# Patient Record
Sex: Male | Born: 1945 | Race: White | Hispanic: No | Marital: Married | Smoking: Never smoker
Health system: Southern US, Community
[De-identification: ages and names within clinical notes are randomized; demographics above are authoritative.]

## PROBLEM LIST (undated history)

## (undated) DIAGNOSIS — F319 Bipolar disorder, unspecified: Secondary | ICD-10-CM

## (undated) DIAGNOSIS — G20 Parkinson's disease: Secondary | ICD-10-CM

## (undated) DIAGNOSIS — I10 Essential (primary) hypertension: Secondary | ICD-10-CM

## (undated) DIAGNOSIS — I639 Cerebral infarction, unspecified: Secondary | ICD-10-CM

## (undated) HISTORY — PX: TONSILLECTOMY: SUR1361

## (undated) HISTORY — PX: ESOPHAGOGASTRODUODENOSCOPY: SHX1529

## (undated) HISTORY — PX: COLONOSCOPY: SHX174

---

## 2005-07-14 ENCOUNTER — Ambulatory Visit: Payer: Self-pay | Admitting: Unknown Physician Specialty

## 2006-05-19 ENCOUNTER — Ambulatory Visit: Payer: Self-pay | Admitting: Internal Medicine

## 2007-07-09 ENCOUNTER — Other Ambulatory Visit: Payer: Self-pay

## 2007-07-09 ENCOUNTER — Inpatient Hospital Stay: Payer: Self-pay | Admitting: Internal Medicine

## 2010-12-28 ENCOUNTER — Emergency Department: Payer: Self-pay | Admitting: Emergency Medicine

## 2013-10-31 ENCOUNTER — Ambulatory Visit: Payer: Self-pay | Admitting: Unknown Physician Specialty

## 2013-11-05 ENCOUNTER — Ambulatory Visit: Payer: Self-pay | Admitting: Internal Medicine

## 2013-11-08 ENCOUNTER — Ambulatory Visit: Payer: Self-pay | Admitting: Unknown Physician Specialty

## 2013-11-09 LAB — PATHOLOGY REPORT

## 2014-04-11 ENCOUNTER — Ambulatory Visit: Payer: Self-pay | Admitting: Unknown Physician Specialty

## 2014-04-12 LAB — PATHOLOGY REPORT

## 2016-02-25 ENCOUNTER — Other Ambulatory Visit: Payer: Self-pay | Admitting: Internal Medicine

## 2016-02-25 DIAGNOSIS — R739 Hyperglycemia, unspecified: Secondary | ICD-10-CM

## 2016-02-25 DIAGNOSIS — E169 Disorder of pancreatic internal secretion, unspecified: Secondary | ICD-10-CM

## 2016-02-25 DIAGNOSIS — L299 Pruritus, unspecified: Secondary | ICD-10-CM

## 2016-02-25 DIAGNOSIS — R748 Abnormal levels of other serum enzymes: Secondary | ICD-10-CM

## 2016-03-04 ENCOUNTER — Ambulatory Visit
Admission: RE | Admit: 2016-03-04 | Discharge: 2016-03-04 | Disposition: A | Discharge status: Home / Self Care | Payer: Medicare Other | Source: Ambulatory Visit | Attending: Internal Medicine | Admitting: Internal Medicine

## 2016-03-04 DIAGNOSIS — E169 Disorder of pancreatic internal secretion, unspecified: Secondary | ICD-10-CM

## 2016-03-04 DIAGNOSIS — N329 Bladder disorder, unspecified: Secondary | ICD-10-CM | POA: Diagnosis not present

## 2016-03-04 DIAGNOSIS — N200 Calculus of kidney: Secondary | ICD-10-CM | POA: Insufficient documentation

## 2016-03-04 DIAGNOSIS — K222 Esophageal obstruction: Secondary | ICD-10-CM | POA: Diagnosis not present

## 2016-03-04 DIAGNOSIS — R918 Other nonspecific abnormal finding of lung field: Secondary | ICD-10-CM | POA: Diagnosis not present

## 2016-03-04 DIAGNOSIS — R748 Abnormal levels of other serum enzymes: Secondary | ICD-10-CM

## 2016-03-04 DIAGNOSIS — R739 Hyperglycemia, unspecified: Secondary | ICD-10-CM

## 2016-03-04 DIAGNOSIS — L299 Pruritus, unspecified: Secondary | ICD-10-CM

## 2016-03-04 HISTORY — DX: Essential (primary) hypertension: I10

## 2016-03-04 MED ORDER — IOPAMIDOL (ISOVUE-300) INJECTION 61%
100.0000 mL | Freq: Once | INTRAVENOUS | Status: AC | PRN
  Administered 2016-03-04: 100 mL via INTRAVENOUS

## 2016-10-22 ENCOUNTER — Ambulatory Visit: Payer: Medicare Other | Attending: Neurology

## 2016-10-22 DIAGNOSIS — G4733 Obstructive sleep apnea (adult) (pediatric): Secondary | ICD-10-CM | POA: Diagnosis not present

## 2016-10-22 DIAGNOSIS — G4761 Periodic limb movement disorder: Secondary | ICD-10-CM | POA: Diagnosis not present

## 2016-10-22 DIAGNOSIS — E669 Obesity, unspecified: Secondary | ICD-10-CM | POA: Insufficient documentation

## 2016-12-29 ENCOUNTER — Inpatient Hospital Stay
Admission: EM | Admit: 2016-12-29 | Discharge: 2016-12-30 | DRG: 069 | Disposition: A | Discharge status: Home / Self Care | Payer: Medicare Other | Attending: Internal Medicine | Admitting: Internal Medicine

## 2016-12-29 ENCOUNTER — Encounter: Payer: Self-pay | Admitting: *Deleted

## 2016-12-29 ENCOUNTER — Emergency Department: Payer: Medicare Other

## 2016-12-29 ENCOUNTER — Inpatient Hospital Stay: Payer: Medicare Other

## 2016-12-29 DIAGNOSIS — I10 Essential (primary) hypertension: Secondary | ICD-10-CM | POA: Diagnosis present

## 2016-12-29 DIAGNOSIS — G20 Parkinson's disease: Secondary | ICD-10-CM | POA: Diagnosis not present

## 2016-12-29 DIAGNOSIS — G459 Transient cerebral ischemic attack, unspecified: Secondary | ICD-10-CM | POA: Diagnosis present

## 2016-12-29 DIAGNOSIS — R7989 Other specified abnormal findings of blood chemistry: Secondary | ICD-10-CM

## 2016-12-29 DIAGNOSIS — Z8673 Personal history of transient ischemic attack (TIA), and cerebral infarction without residual deficits: Secondary | ICD-10-CM

## 2016-12-29 DIAGNOSIS — Z820 Family history of epilepsy and other diseases of the nervous system: Secondary | ICD-10-CM | POA: Diagnosis not present

## 2016-12-29 DIAGNOSIS — R778 Other specified abnormalities of plasma proteins: Secondary | ICD-10-CM | POA: Diagnosis present

## 2016-12-29 DIAGNOSIS — Z79899 Other long term (current) drug therapy: Secondary | ICD-10-CM | POA: Diagnosis not present

## 2016-12-29 DIAGNOSIS — F319 Bipolar disorder, unspecified: Secondary | ICD-10-CM | POA: Diagnosis present

## 2016-12-29 DIAGNOSIS — R4020 Unspecified coma: Secondary | ICD-10-CM

## 2016-12-29 DIAGNOSIS — R410 Disorientation, unspecified: Secondary | ICD-10-CM

## 2016-12-29 DIAGNOSIS — R4781 Slurred speech: Secondary | ICD-10-CM | POA: Diagnosis not present

## 2016-12-29 DIAGNOSIS — R29700 NIHSS score 0: Secondary | ICD-10-CM | POA: Diagnosis not present

## 2016-12-29 DIAGNOSIS — Z8249 Family history of ischemic heart disease and other diseases of the circulatory system: Secondary | ICD-10-CM | POA: Diagnosis not present

## 2016-12-29 DIAGNOSIS — R55 Syncope and collapse: Secondary | ICD-10-CM | POA: Diagnosis not present

## 2016-12-29 DIAGNOSIS — N289 Disorder of kidney and ureter, unspecified: Secondary | ICD-10-CM

## 2016-12-29 DIAGNOSIS — G8321 Monoplegia of upper limb affecting right dominant side: Secondary | ICD-10-CM | POA: Diagnosis not present

## 2016-12-29 DIAGNOSIS — R9431 Abnormal electrocardiogram [ECG] [EKG]: Secondary | ICD-10-CM

## 2016-12-29 DIAGNOSIS — Z7982 Long term (current) use of aspirin: Secondary | ICD-10-CM | POA: Diagnosis not present

## 2016-12-29 DIAGNOSIS — I4581 Long QT syndrome: Secondary | ICD-10-CM | POA: Diagnosis present

## 2016-12-29 DIAGNOSIS — R000 Tachycardia, unspecified: Secondary | ICD-10-CM | POA: Diagnosis not present

## 2016-12-29 DIAGNOSIS — K219 Gastro-esophageal reflux disease without esophagitis: Secondary | ICD-10-CM | POA: Diagnosis not present

## 2016-12-29 DIAGNOSIS — G451 Carotid artery syndrome (hemispheric): Secondary | ICD-10-CM

## 2016-12-29 DIAGNOSIS — R4701 Aphasia: Secondary | ICD-10-CM

## 2016-12-29 HISTORY — DX: Parkinson's disease: G20

## 2016-12-29 HISTORY — DX: Bipolar disorder, unspecified: F31.9

## 2016-12-29 HISTORY — DX: Cerebral infarction, unspecified: I63.9

## 2016-12-29 LAB — CBC
HCT: 46.2 % (ref 40.0–52.0)
Hemoglobin: 15.4 g/dL (ref 13.0–18.0)
MCH: 28.5 pg (ref 26.0–34.0)
MCHC: 33.4 g/dL (ref 32.0–36.0)
MCV: 85.3 fL (ref 80.0–100.0)
Platelets: 283 10*3/uL (ref 150–440)
RBC: 5.41 MIL/uL (ref 4.40–5.90)
RDW: 14.5 % (ref 11.5–14.5)
WBC: 13.1 10*3/uL — ABNORMAL HIGH (ref 3.8–10.6)

## 2016-12-29 LAB — URINALYSIS, COMPLETE (UACMP) WITH MICROSCOPIC
Bacteria, UA: NONE SEEN
Bilirubin Urine: NEGATIVE
Glucose, UA: NEGATIVE mg/dL
Ketones, ur: 5 mg/dL — AB
Leukocytes, UA: NEGATIVE
Nitrite: NEGATIVE
PH: 5 (ref 5.0–8.0)
Protein, ur: 30 mg/dL — AB
Specific Gravity, Urine: 1.017 (ref 1.005–1.030)
Squamous Epithelial / LPF: NONE SEEN

## 2016-12-29 LAB — HEPATIC FUNCTION PANEL
ALT: 17 U/L (ref 17–63)
AST: 29 U/L (ref 15–41)
Albumin: 4.2 g/dL (ref 3.5–5.0)
Alkaline Phosphatase: 113 U/L (ref 38–126)
Total Bilirubin: 0.6 mg/dL (ref 0.3–1.2)
Total Protein: 7.9 g/dL (ref 6.5–8.1)

## 2016-12-29 LAB — GLUCOSE, CAPILLARY: GLUCOSE-CAPILLARY: 162 mg/dL — AB (ref 65–99)

## 2016-12-29 LAB — AMMONIA: AMMONIA: 22 umol/L (ref 9–35)

## 2016-12-29 LAB — BASIC METABOLIC PANEL
Anion gap: 13 (ref 5–15)
BUN: 16 mg/dL (ref 6–20)
CALCIUM: 9.4 mg/dL (ref 8.9–10.3)
CO2: 22 mmol/L (ref 22–32)
CREATININE: 1.59 mg/dL — AB (ref 0.61–1.24)
Chloride: 104 mmol/L (ref 101–111)
GFR calc non Af Amer: 43 mL/min — ABNORMAL LOW (ref >60)
GFR, EST AFRICAN AMERICAN: 49 mL/min — AB (ref >60)
GLUCOSE: 149 mg/dL — AB (ref 65–99)
Potassium: 3.5 mmol/L (ref 3.5–5.1)
Sodium: 139 mmol/L (ref 135–145)

## 2016-12-29 LAB — TROPONIN I: TROPONIN I: 0.04 ng/mL — AB (ref <0.03)

## 2016-12-29 MED ORDER — GADOBENATE DIMEGLUMINE 529 MG/ML IV SOLN
15.0000 mL | Freq: Once | INTRAVENOUS | Status: AC | PRN
  Administered 2016-12-29: 10 mL via INTRAVENOUS

## 2016-12-29 MED ORDER — SIMVASTATIN 20 MG PO TABS
40.0000 mg | ORAL_TABLET | Freq: Every day | ORAL | Status: DC
  Administered 2016-12-30: 40 mg via ORAL
  Filled 2016-12-29: qty 2

## 2016-12-29 MED ORDER — ASPIRIN 81 MG PO CHEW
324.0000 mg | CHEWABLE_TABLET | Freq: Once | ORAL | Status: AC
  Administered 2016-12-29: 324 mg via ORAL
  Filled 2016-12-29: qty 4

## 2016-12-29 MED ORDER — CLONAZEPAM 0.5 MG PO TABS
0.5000 mg | ORAL_TABLET | Freq: Every day | ORAL | Status: DC
  Administered 2016-12-30: 10:00:00 0.5 mg via ORAL
  Filled 2016-12-29: qty 1

## 2016-12-29 MED ORDER — ACETAMINOPHEN 160 MG/5ML PO SOLN
650.0000 mg | ORAL | Status: DC | PRN
Start: 2016-12-29 — End: 2016-12-30

## 2016-12-29 MED ORDER — STROKE: EARLY STAGES OF RECOVERY BOOK
Freq: Once | Status: AC
  Administered 2016-12-30: 02:00:00

## 2016-12-29 MED ORDER — ACETAMINOPHEN 650 MG RE SUPP: 650.0000 mg | RECTAL | Status: DC | PRN

## 2016-12-29 MED ORDER — VENLAFAXINE HCL 37.5 MG PO TABS
75.0000 mg | ORAL_TABLET | Freq: Every day | ORAL | Status: DC
  Administered 2016-12-30: 10:00:00 75 mg via ORAL
  Filled 2016-12-29: qty 1

## 2016-12-29 MED ORDER — ENOXAPARIN SODIUM 40 MG/0.4ML ~~LOC~~ SOLN
40.0000 mg | SUBCUTANEOUS | Status: DC
  Administered 2016-12-30: 10:00:00 40 mg via SUBCUTANEOUS
  Filled 2016-12-29: qty 0.4

## 2016-12-29 MED ORDER — BENZTROPINE MESYLATE 0.5 MG PO TABS
0.5000 mg | ORAL_TABLET | Freq: Every day | ORAL | Status: DC
Start: 2016-12-30 — End: 2016-12-30
  Administered 2016-12-30: 0.5 mg via ORAL
  Filled 2016-12-29: qty 1

## 2016-12-29 MED ORDER — PANTOPRAZOLE SODIUM 40 MG PO TBEC
40.0000 mg | DELAYED_RELEASE_TABLET | Freq: Two times a day (BID) | ORAL | Status: DC
  Administered 2016-12-30 (×2): 40 mg via ORAL
  Filled 2016-12-29 (×2): qty 1

## 2016-12-29 MED ORDER — ASPIRIN 325 MG PO TABS
325.0000 mg | ORAL_TABLET | Freq: Every day | ORAL | Status: DC
  Administered 2016-12-30: 10:00:00 325 mg via ORAL
  Filled 2016-12-29: qty 1

## 2016-12-29 MED ORDER — ACETAMINOPHEN 325 MG PO TABS: 650.0000 mg | ORAL_TABLET | ORAL | Status: DC | PRN

## 2016-12-29 MED ORDER — LAMOTRIGINE 100 MG PO TABS
200.0000 mg | ORAL_TABLET | Freq: Every day | ORAL | Status: DC
  Administered 2016-12-30: 10:00:00 200 mg via ORAL
  Filled 2016-12-29: qty 2

## 2016-12-29 MED ORDER — CARBIDOPA-LEVODOPA 25-100 MG PO TABS
1.0000 | ORAL_TABLET | Freq: Two times a day (BID) | ORAL | Status: DC
  Administered 2016-12-30: 1 via ORAL
  Filled 2016-12-29 (×3): qty 1

## 2016-12-29 MED ORDER — SODIUM CHLORIDE 0.9 % IV BOLUS (SEPSIS)
1000.0000 mL | Freq: Once | INTRAVENOUS | Status: AC
  Administered 2016-12-29: 1000 mL via INTRAVENOUS
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
## 2016-12-30 ENCOUNTER — Inpatient Hospital Stay: Payer: Medicare Other

## 2016-12-30 ENCOUNTER — Inpatient Hospital Stay (HOSPITAL_COMMUNITY)
Admit: 2016-12-30 | Discharge: 2016-12-30 | Disposition: A | Discharge status: Home / Self Care | Payer: Medicare Other | Attending: Internal Medicine | Admitting: Internal Medicine

## 2016-12-30 DIAGNOSIS — G451 Carotid artery syndrome (hemispheric): Secondary | ICD-10-CM

## 2016-12-30 DIAGNOSIS — G459 Transient cerebral ischemic attack, unspecified: Secondary | ICD-10-CM | POA: Diagnosis not present

## 2016-12-30 DIAGNOSIS — R410 Disorientation, unspecified: Secondary | ICD-10-CM | POA: Diagnosis not present

## 2016-12-30 LAB — ECHOCARDIOGRAM COMPLETE
HEIGHTINCHES: 66 in
Weight: 3470.4 oz

## 2016-12-30 LAB — LIPID PANEL
CHOL/HDL RATIO: 5.1 ratio
CHOLESTEROL: 142 mg/dL (ref 0–200)
HDL: 28 mg/dL — AB (ref >40)
LDL Cholesterol: 81 mg/dL (ref 0–99)
Triglycerides: 167 mg/dL — ABNORMAL HIGH (ref <150)
VLDL: 33 mg/dL (ref 0–40)

## 2016-12-30 MED ORDER — CLOPIDOGREL BISULFATE 75 MG PO TABS: 75.0000 mg | ORAL_TABLET | Freq: Every day | ORAL | 0 refills | Status: AC

## 2016-12-30 MED ORDER — CLOPIDOGREL BISULFATE 75 MG PO TABS
75.0000 mg | ORAL_TABLET | Freq: Every day | ORAL | Status: DC
  Administered 2016-12-30: 15:00:00 75 mg via ORAL
  Filled 2016-12-30: qty 1
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
## 2016-12-30 NOTE — Progress Notes (Signed)
*  PRELIMINARY RESULTS* Echocardiogram 2D Echocardiogram has been performed.  Cristela BlueHege, Leyna Vanderkolk 12/30/2016, 1:32 PM

## 2016-12-30 NOTE — Discharge Summary (Signed)
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 corona radiata/centrum semi ovale. Encephalomalacia with gliosis within the left frontal operculum compatible with remote left MCA territory infarct. Chronic microvascular ischemic changes present within the pons as well. No abnormal foci of restricted diffusion to suggest acute or subacute ischemia. Gray-white matter differentiation maintained. No other areas of chronic infarction identified. No evidence for acute or chronic intracranial hemorrhage. No mass lesion, midline shift or mass effect. Mild ventricular prominence related global parenchymal volume loss of hydrocephalus. No extra-axial fluid collection. Major dural sinuses grossly patent. No abnormal enhancement. Pituitary gland suprasellar region within normal limits. Midline structures intact and normal. Major intracranial vascular flow voids are maintained. Craniocervical junction within normal limits. Visualized upper cervical spine unremarkable. Bone marrow signal intensity normal. No scalp soft tissue abnormality. Globes and orbital soft tissues within normal limits. Mild scattered mucosal thickening  within the ethmoidal air cells and maxillary sinuses. Paranasal sinuses otherwise clear. No mastoid effusion. Inner ear structures normal. MRA HEAD FINDINGS ANTERIOR CIRCULATION: Distal cervical segments of the internal carotid arteries are patent with antegrade flow. Petrous, cavernous, and supraclinoid segments patent bilaterally without stenosis. ICA termini patent. A1 segments patent. Anterior communicating artery normal. Anterior cerebral arteries patent to their distal aspects. M1 segments demonstrate scattered atheromatous irregularity without occlusion or flow-limiting stenosis. MCA bifurcations normal. No proximal M2 occlusion. Distal MCA branches well opacified and symmetric. POSTERIOR CIRCULATION: Visualized vertebral arteries patent to the vertebrobasilar junction. Posterior inferior cerebral arteries patent proximally. Basilar artery widely patent to its distal aspect. Superior cerebellar is patent bilaterally. Both of the posterior cerebral artery supplied via the basilar and are widely patent to their distal aspects. Distal small vessel atheromatous irregularity noted within the intracranial circulation. No aneurysm or vascular malformation. MRA NECK FINDINGS Source images reviewed. Visualized aortic arch of normal caliber with normal branch pattern. No flow-limiting stenosis about the origin of the great vessels. Visualized subclavian artery is widely patent. Right common carotid artery patent from its origin to the bifurcation. Mild atheromatous irregularity about the right carotid bifurcation without flow-limiting stenosis. Right ICA widely patent distally to the skullbase without stenosis or occlusion. Left common carotid artery patent from its origin to the bifurcation. Mild atheromatous irregularity about the left carotid bifurcation without stenosis. Left ICA patent from the bifurcation to the skullbase without stenosis or occlusion. Both of the vertebral arteries arise from the subclavian  arteries. Vertebral artery is largely code dominant. Vertebral arteries widely patent within the neck with antegrade flow without stenosis or occlusion. IMPRESSION: MRI HEAD IMPRESSION: 1. No acute intracranial infarct or other process identified. 2. 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 MRA HEAD IMPRESSION: 1. No large or proximal arterial branch occlusion. No high-grade or correctable stenosis within the intracranial circulation. 2. Mild scattered intracranial atherosclerosis as above. MRA NECK IMPRESSION: 1. Negative MRA of the neck. No high-grade or critical stenosis within the major arterial vasculature of the neck. 2. Mild atheromatous irregularity about the carotid bifurcations bilaterally without significant stenosis. 3. Widely patent vertebral arteries within the neck.  Electronically Signed   By: Rise Mu M.D.   On: 12/29/2016 23:53     Management plans discussed with the patient, family and they are in agreement.  CODE STATUS:     Code Status Orders        Start     Ordered   12/29/16 2336  Full code  Continuous     12/29/16 2336    Code Status History    Date Active Date Inactive Code Status Order ID Comments User Context   This patient has a current code status but no historical code status.    Advance Directive Documentation     Most Recent Value  Type of Advance Directive  Healthcare Power of Attorney, Living will  Pre-existing out of facility DNR order (yellow form or pink MOST form)  -  "MOST" Form in Place?  -      TOTAL TIME TAKING CARE OF THIS PATIENT: 40 minutes.    Sota Hetz M.D on 12/30/2016 at 2:36 PM  Between 7am to 6pm - Pager - [phone] After 6pm go to [email or web address] - Social research officer, government  [facility]  Office  [phone]  CC: Primary care physician; Barbette Reichmann, MD

## 2016-12-30 NOTE — Progress Notes (Signed)
Called by patient's RN about 72 hour holter monitor.  Informed the nurse we only perform 24 hour and 48 hour holter monitors.  I also at this time informed the nurse that there were no holter monitors in the hospital they were all outside of the hospital on patients.

## 2016-12-30 NOTE — Care Management (Signed)
Occupational and physical therapy evaluations completed. Recommending outpatient therapies. Will get Dr. Eliane DecreePatel's signature and fax to rehabilitation department. Mr. Logan Powell is in agreement with this plan Discharge to home per Dr. Ouida SillsPatel Lylian Sanagustin S Diago Haik RN MSN CCM Care Managemment [phone]

## 2016-12-30 NOTE — Plan of Care (Signed)
Problem: SLP Dysphagia Goals Goal: Misc Dysphagia Goal Pt will tolerate least restrictive diet considered safe without s/s of aspiration over 2 sessions.
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
## 2016-12-30 NOTE — Consult Note (Signed)
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 Maxine Glenn Head/brain ZO Cm  Result Date: 12/29/2016 CLINICAL DATA:  Initial evaluation for acute expressive aphasia, fall. EXAM: MRI HEAD WITHOUT CONTRAST MRA HEAD WITHOUT CONTRAST MRA NECK WITHOUT AND WITH CONTRAST TECHNIQUE: Multiplanar, multiecho pulse sequences of the brain and surrounding structures were obtained without intravenous contrast. Angiographic images of the Circle of Willis were obtained using MRA technique without intravenous contrast. Angiographic images of the neck were obtained using MRA technique without and with intravenous contrast. Carotid stenosis measurements (when applicable) are obtained utilizing NASCET criteria, using the distal internal carotid diameter as the denominator. CONTRAST:  10mL MULTIHANCE GADOBENATE DIMEGLUMINE 529 MG/ML IV SOLN COMPARISON:  Comparison made with prior CT from earlier the same day. FINDINGS: MRI HEAD FINDINGS Diffuse prominence of the CSF containing spaces compatible with generalized cerebral atrophy. Patchy and confluent T2/FLAIR hyperintensity within the periventricular and deep white matter both cerebral hemispheres most  consistent with chronic small vessel ischemic disease, moderate nature. Few scattered superimposed remote lacunar infarcts present within the periventricular white matter of the bilateral corona radiata/centrum semi ovale. Encephalomalacia with gliosis within the left frontal operculum compatible with remote left MCA territory infarct. Chronic microvascular ischemic changes present within the pons as well. No abnormal foci of restricted diffusion to suggest acute or subacute ischemia. Gray-white matter differentiation maintained. No other areas of chronic infarction identified. No evidence for acute or chronic intracranial hemorrhage. No mass lesion, midline shift or mass effect. Mild ventricular prominence related global parenchymal volume loss of hydrocephalus. No extra-axial fluid collection. Major dural sinuses grossly patent. No abnormal enhancement. Pituitary gland suprasellar region within normal limits. Midline structures intact and normal. Major intracranial vascular flow voids are maintained. Craniocervical junction within normal limits. Visualized upper cervical spine unremarkable. Bone marrow signal intensity normal. No scalp soft tissue abnormality. Globes and orbital soft tissues within normal limits. Mild scattered mucosal thickening within the ethmoidal air cells and maxillary sinuses. Paranasal sinuses otherwise clear. No mastoid effusion. Inner ear structures normal. MRA HEAD FINDINGS ANTERIOR CIRCULATION: Distal cervical segments of the internal carotid arteries are patent with antegrade flow. Petrous, cavernous, and supraclinoid segments patent bilaterally without stenosis. ICA termini patent. A1 segments patent. Anterior communicating artery normal. Anterior cerebral arteries patent to their distal aspects. M1 segments demonstrate scattered atheromatous irregularity without occlusion or flow-limiting stenosis. MCA bifurcations normal. No proximal M2 occlusion. Distal MCA branches well opacified  and symmetric. POSTERIOR CIRCULATION: Visualized vertebral arteries patent to the vertebrobasilar junction. Posterior inferior cerebral arteries patent proximally. Basilar artery widely patent to its distal aspect. Superior cerebellar is patent bilaterally. Both of the posterior cerebral artery supplied via the basilar and are widely patent to their distal aspects. Distal small vessel atheromatous irregularity noted within the intracranial circulation. No aneurysm or vascular malformation. MRA NECK FINDINGS Source images reviewed. Visualized aortic arch of normal caliber with normal branch pattern. No flow-limiting stenosis about the origin of the great vessels. Visualized subclavian artery is widely patent. Right common carotid artery patent from its origin to the bifurcation. Mild atheromatous irregularity about the right carotid  bifurcation without flow-limiting stenosis. Right ICA widely patent distally to the skullbase without stenosis or occlusion. Left common carotid artery patent from its origin to the bifurcation. Mild atheromatous irregularity about the left carotid bifurcation without stenosis. Left ICA patent from the bifurcation to the skullbase without stenosis or occlusion. Both of the vertebral arteries arise from the subclavian arteries. Vertebral artery is largely code dominant. Vertebral arteries widely patent within the neck with antegrade flow without stenosis or occlusion. IMPRESSION: MRI HEAD IMPRESSION: 1. No acute intracranial infarct or other process identified. 2. 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 MRA HEAD IMPRESSION: 1. No large or proximal arterial branch occlusion. No high-grade or correctable stenosis within the intracranial circulation. 2. Mild scattered intracranial atherosclerosis as above. MRA NECK IMPRESSION: 1. Negative MRA of the neck. No high-grade or critical stenosis within the major arterial  vasculature of the neck. 2. Mild atheromatous irregularity about the carotid bifurcations bilaterally without significant stenosis. 3. Widely patent vertebral arteries within the neck. Electronically Signed   By: Rise Mu M.D.   On: 12/29/2016 23:53    Assessment: 71 y.o. male with episodes of right sided weakness and slurred speech.  On ASA at home.  MRI of the brain reviewed and shows no acute changes.  No significant stenosis noted on MRA imaging as well.  A1c pending.  LDL 81.  Echocardiogram pending.  TIA on the differential but can not rule out the possibility of seizure as well.  Further work up recommended.    Stroke Risk Factors - hypertension  Plan: 1. PT consult, OT consult, Speech consult 2. Prophylactic therapy-Would continue ASA at 81mg  daily and add Plavix at 75mg  daily 3. Telemetry monitoring 4. Frequent neuro checks 5. EEG 6. If above work up unremarkable would consider long term cardiac monitoring as an outpatient.   7. Patient to follow up with neurology as an outpatient 8. Patient unable to drive until release by outpatient physician.    Thana Farr, MD Neurology [phone] 12/30/2016, 10:30 AM

## 2016-12-30 NOTE — Progress Notes (Signed)
Pt left at this time  Via w/c for home. Alert. No resp distress. Discharge instructions discussed with pt and wife.  presc and home meds discussed / diet activity and f/u discussed.verbalized understanding

## 2016-12-31 LAB — HEMOGLOBIN A1C
HEMOGLOBIN A1C: 6.8 % — AB (ref 4.8–5.6)
MEAN PLASMA GLUCOSE: 148 mg/dL

## 2017-02-02 DEATH — deceased

## 2019-03-04 IMAGING — MR MR MRA HEAD W/O CM
1 series · 19 of 48 positions shown · IV contrast (multihance)
Comparison: Comparison made with prior CT from earlier the same
day.

CLINICAL DATA: Initial evaluation for acute expressive aphasia,
fall.

EXAM:
MRI HEAD WITHOUT CONTRAST
MRA HEAD WITHOUT CONTRAST
MRA NECK WITHOUT AND WITH CONTRAST
TECHNIQUE: Multiplanar, multiecho pulse sequences of the brain and surrounding
structures were obtained without intravenous contrast. Angiographic
images of the Circle of Willis were obtained using MRA technique
without intravenous contrast. Angiographic images of the neck were
obtained using MRA technique without and with intravenous contrast.
Carotid stenosis measurements (when applicable) are obtained
utilizing NASCET criteria, using the distal internal carotid
diameter as the denominator.
CONTRAST:  10mL MULTIHANCE GADOBENATE DIMEGLUMINE 529 MG/ML IV SOLN

[Series 2: TOF · axial · non-contrast · 0.7mm · 0.37mm/px · z∈[-52,+37]mm · 19 of 140 slices shown]
[im 1/140]
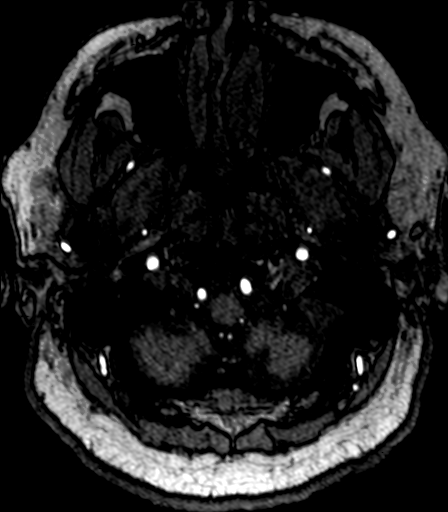
[im 3/140]
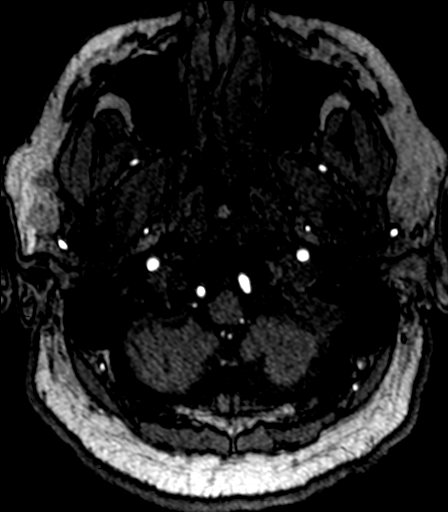
[im 6/140]
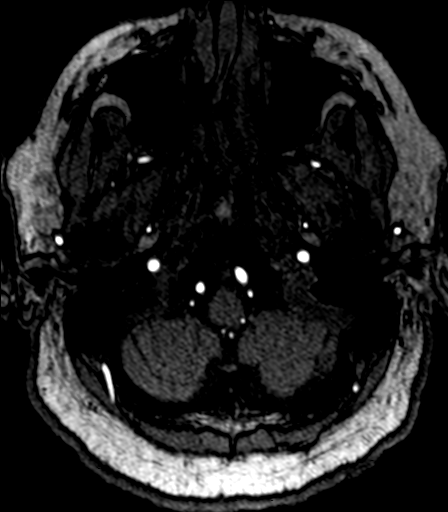
[im 9/140]
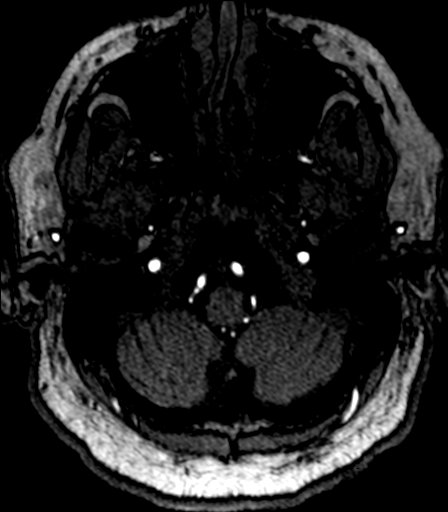
[im 12/140]
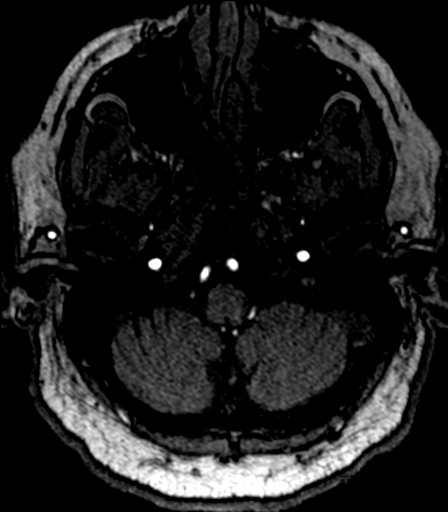
[im 15/140]
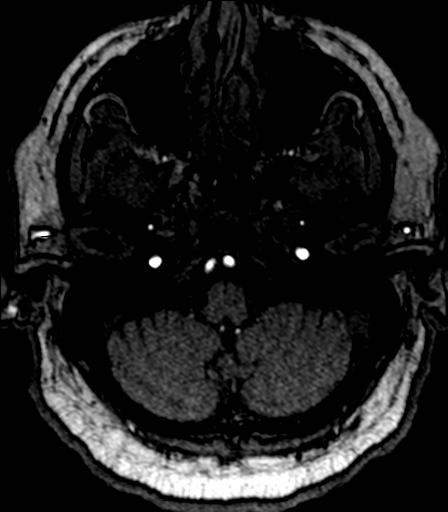
[im 18/140]
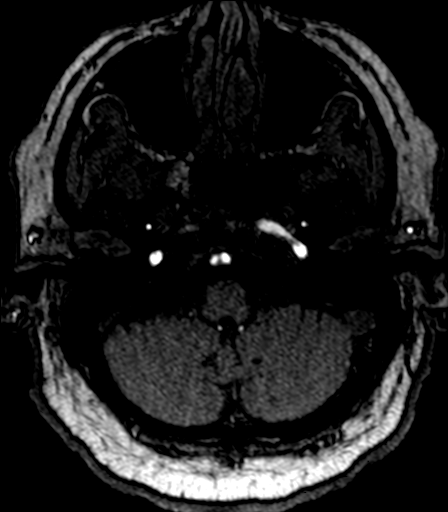
[im 21/140]
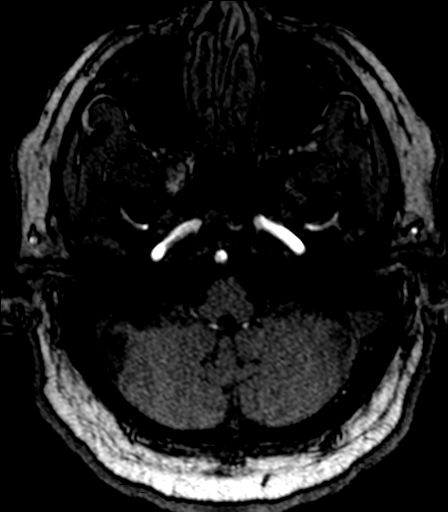
[im 24/140]
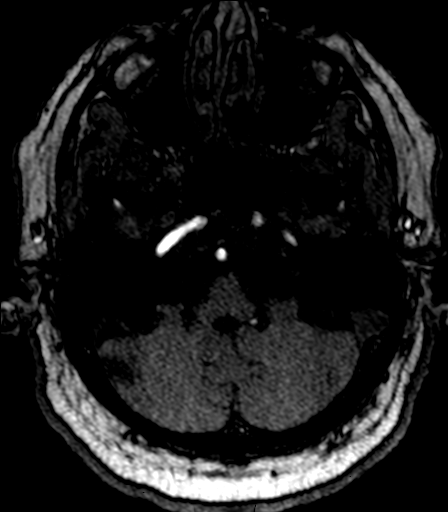
[im 27/140]
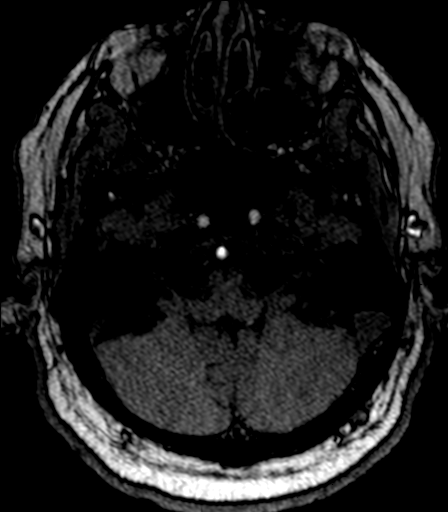
[im 30/140]
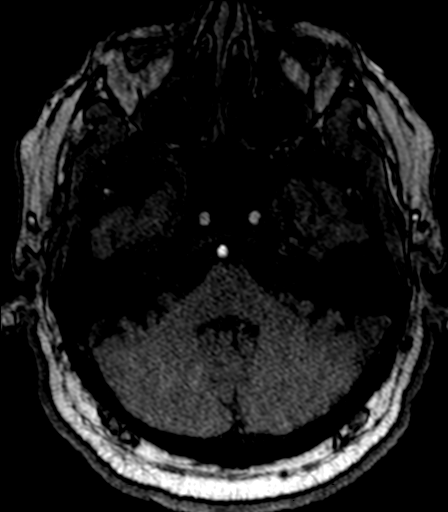
[im 45/140]
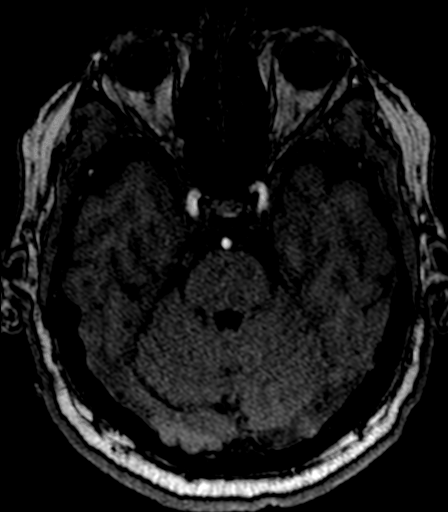
[im 63/140]
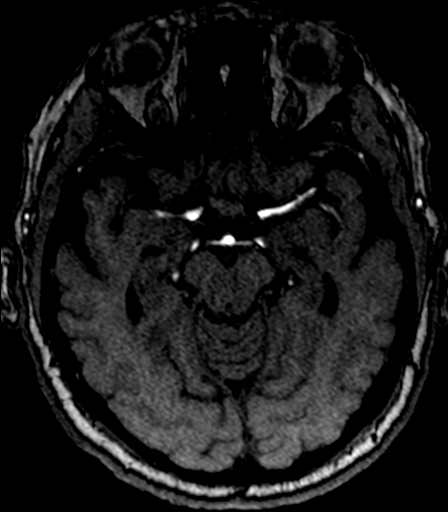
[im 71/140]
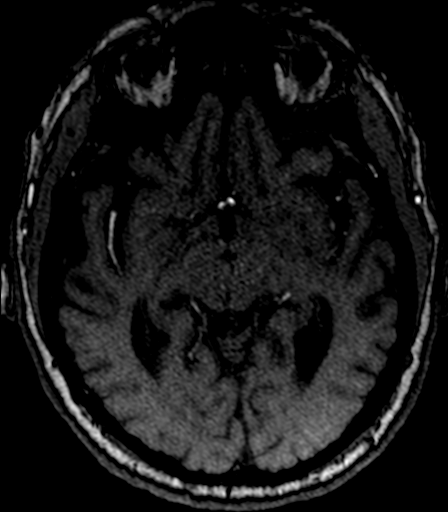
[im 80/140]
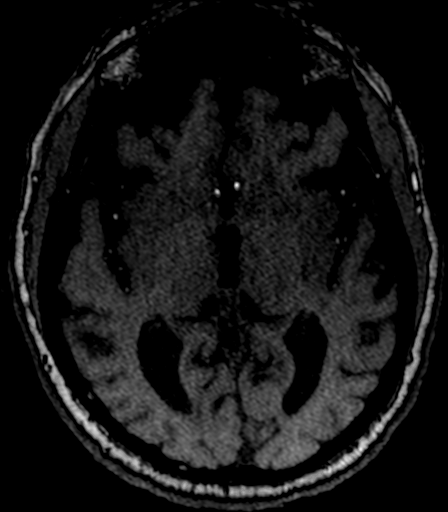
[im 98/140]
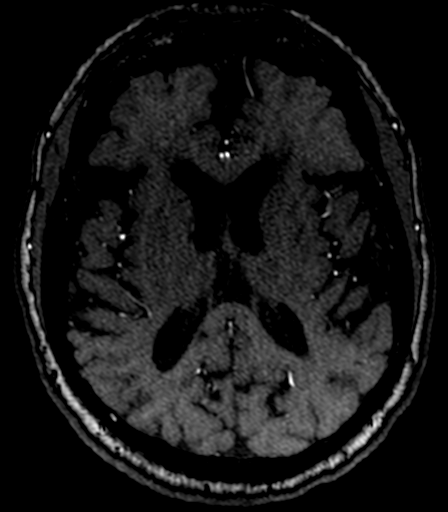
[im 116/140]
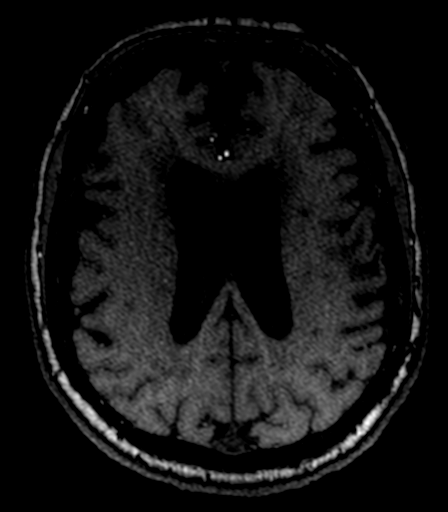
[im 119/140]
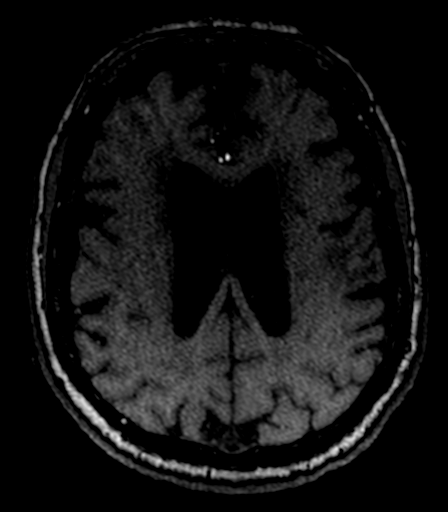
[im 134/140]
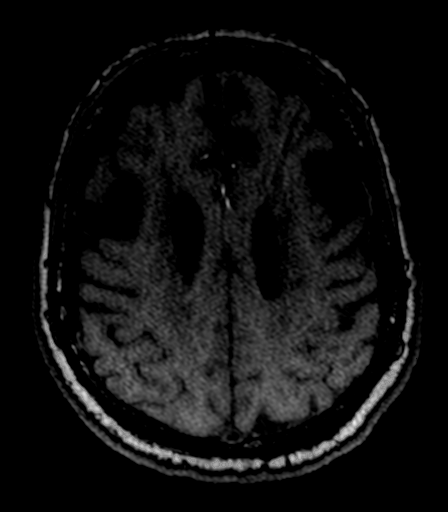

[19 of 48 positions shown; findings below may reference images not displayed]

FINDINGS: MRI HEAD FINDINGS

Diffuse prominence of the CSF containing spaces compatible with
generalized cerebral atrophy. Patchy and confluent T2/FLAIR
hyperintensity within the periventricular and deep white matter both
cerebral hemispheres most consistent with chronic small vessel
ischemic disease, moderate nature. Few scattered superimposed remote
lacunar infarcts present within the periventricular white matter of
the bilateral corona radiata/centrum semi ovale. Encephalomalacia
with gliosis within the left frontal operculum compatible with
remote left MCA territory infarct. Chronic microvascular ischemic
changes present within the pons as well.

No abnormal foci of restricted diffusion to suggest acute or
subacute ischemia. Gray-white matter differentiation maintained. No
other areas of chronic infarction identified. No evidence for acute
or chronic intracranial hemorrhage.

No mass lesion, midline shift or mass effect. Mild ventricular
prominence related global parenchymal volume loss of hydrocephalus.
No extra-axial fluid collection. Major dural sinuses grossly patent.
No abnormal enhancement.

Pituitary gland suprasellar region within normal limits. Midline
structures intact and normal.

Major intracranial vascular flow voids are maintained.

Craniocervical junction within normal limits. Visualized upper
cervical spine unremarkable. Bone marrow signal intensity normal. No
scalp soft tissue abnormality.

Globes and orbital soft tissues within normal limits. Mild scattered
mucosal thickening within the ethmoidal air cells and maxillary
sinuses. Paranasal sinuses otherwise clear. No mastoid effusion.
Inner ear structures normal.

MRA HEAD FINDINGS

ANTERIOR CIRCULATION:

Distal cervical segments of the internal carotid arteries are patent
with antegrade flow. Petrous, cavernous, and supraclinoid segments
patent bilaterally without stenosis. ICA termini patent. A1 segments
patent. Anterior communicating artery normal. Anterior cerebral
arteries patent to their distal aspects. M1 segments demonstrate
scattered atheromatous irregularity without occlusion or
flow-limiting stenosis. MCA bifurcations normal. No proximal M2
occlusion. Distal MCA branches well opacified and symmetric.

POSTERIOR CIRCULATION:

Visualized vertebral arteries patent to the vertebrobasilar
junction. Posterior inferior cerebral arteries patent proximally.
Basilar artery widely patent to its distal aspect. Superior
cerebellar is patent bilaterally. Both of the posterior cerebral
artery supplied via the basilar and are widely patent to their
distal aspects.

Distal small vessel atheromatous irregularity noted within the
intracranial circulation. No aneurysm or vascular malformation.

MRA NECK FINDINGS

Source images reviewed.

Visualized aortic arch of normal caliber with normal branch pattern.
No flow-limiting stenosis about the origin of the great vessels.
Visualized subclavian artery is widely patent.

Right common carotid artery patent from its origin to the
bifurcation. Mild atheromatous irregularity about the right carotid
bifurcation without flow-limiting stenosis. Right ICA widely patent
distally to the skullbase without stenosis or occlusion.

Left common carotid artery patent from its origin to the
bifurcation. Mild atheromatous irregularity about the left carotid
bifurcation without stenosis. Left ICA patent from the bifurcation
to the skullbase without stenosis or occlusion.

Both of the vertebral arteries arise from the subclavian arteries.
Vertebral artery is largely code dominant. Vertebral arteries widely
patent within the neck with antegrade flow without stenosis or
occlusion.
IMPRESSION: MRI HEAD IMPRESSION:

1. No acute intracranial infarct or other process identified.
2. Moderate cerebral atrophy with chronic microvascular ischemic
disease and remote left MCA territory infarct.

MRA HEAD IMPRESSION:

1. No large or proximal arterial branch occlusion. No high-grade or
correctable stenosis within the intracranial circulation.
2. Mild scattered intracranial atherosclerosis as above.

MRA NECK IMPRESSION:

1. Negative MRA of the neck. No high-grade or critical stenosis
within the major arterial vasculature of the neck.
2. Mild atheromatous irregularity about the carotid bifurcations
bilaterally without significant stenosis.
3. Widely patent vertebral arteries within the neck.
